# Patient Record
Sex: Male | Born: 1970
Health system: Southern US, Community
[De-identification: ages and names within clinical notes are randomized; demographics above are authoritative.]

## PROBLEM LIST (undated history)

## (undated) DIAGNOSIS — F419 Anxiety disorder, unspecified: Secondary | ICD-10-CM

## (undated) DIAGNOSIS — K219 Gastro-esophageal reflux disease without esophagitis: Secondary | ICD-10-CM

## (undated) HISTORY — PX: HERNIA REPAIR: SHX51

---

## 2007-11-03 ENCOUNTER — Ambulatory Visit: Payer: Self-pay | Admitting: Family Medicine

## 2009-09-04 ENCOUNTER — Ambulatory Visit: Payer: Self-pay | Admitting: Internal Medicine

## 2010-09-18 ENCOUNTER — Ambulatory Visit: Payer: Self-pay | Admitting: Internal Medicine

## 2010-10-12 ENCOUNTER — Ambulatory Visit: Payer: Self-pay | Admitting: Internal Medicine

## 2010-10-31 ENCOUNTER — Ambulatory Visit: Payer: Self-pay | Admitting: Family Medicine

## 2011-01-28 ENCOUNTER — Ambulatory Visit: Payer: Self-pay | Admitting: Internal Medicine

## 2011-12-16 ENCOUNTER — Ambulatory Visit: Payer: Self-pay

## 2012-05-06 ENCOUNTER — Ambulatory Visit: Payer: Self-pay | Admitting: Family Medicine

## 2012-05-06 LAB — URINALYSIS, COMPLETE
Ketone: NEGATIVE
Nitrite: NEGATIVE
Ph: 6.5 (ref 4.5–8.0)
Protein: NEGATIVE

## 2012-05-26 ENCOUNTER — Ambulatory Visit: Payer: Self-pay | Admitting: Internal Medicine

## 2014-02-07 ENCOUNTER — Ambulatory Visit: Payer: Self-pay | Admitting: Physician Assistant

## 2014-02-15 ENCOUNTER — Ambulatory Visit: Payer: Self-pay | Admitting: Family Medicine

## 2014-06-22 IMAGING — CT CT STONE STUDY
1 of 2 series · 11 of 16 positions shown, 14 images · non-contrast
Comparison: none

REASON FOR EXAM: CR [id number]. urine urgency+freq+ hematuria.
COMMENTS:

PROCEDURE:     CT  - CT ABDOMEN /PELVIS WO (STONE)  - May 06, 2012  [DATE]
RESULT:     Comparison: None
TECHNIQUE: Multiple axial images from the lung bases to the symphysis pubis
were obtained without oral and without intravenous contrast.

[Series 2: soft tissue · axial · 0.78mm/px · z∈[-1012,-608]mm · 11 of 163 slices shown, 14 images]
[im 14/163  soft-tissue]
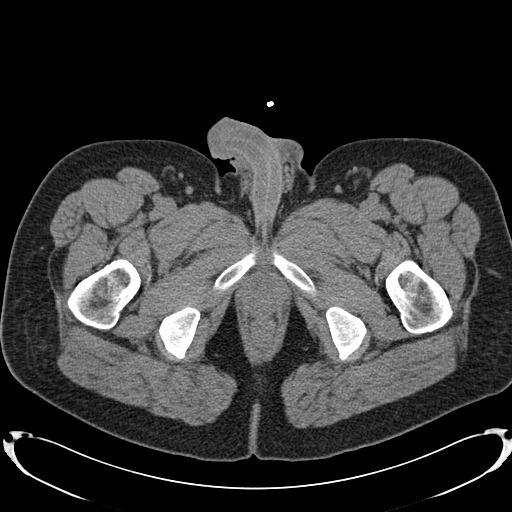
[im 14/163  bone]
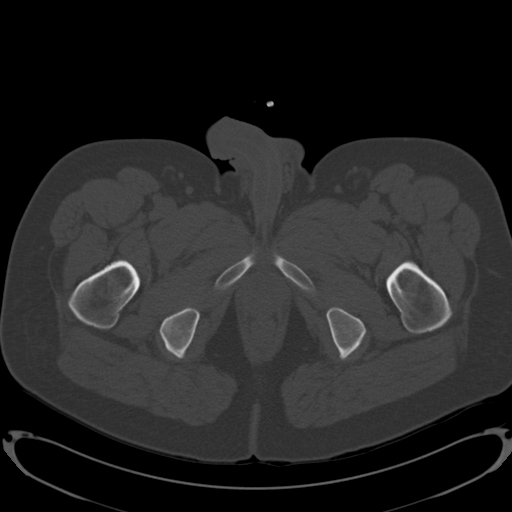
[im 28/163  soft-tissue]
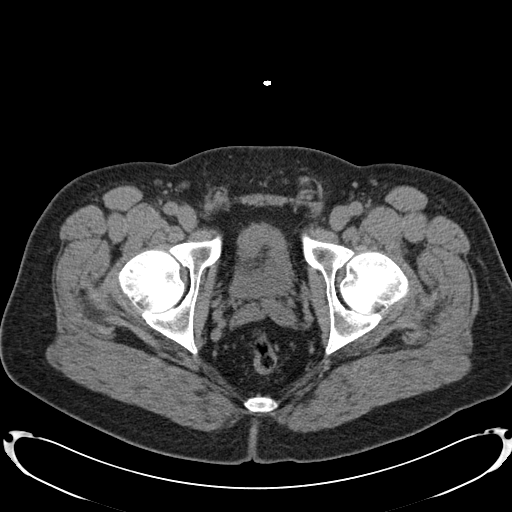
[im 41/163  soft-tissue]
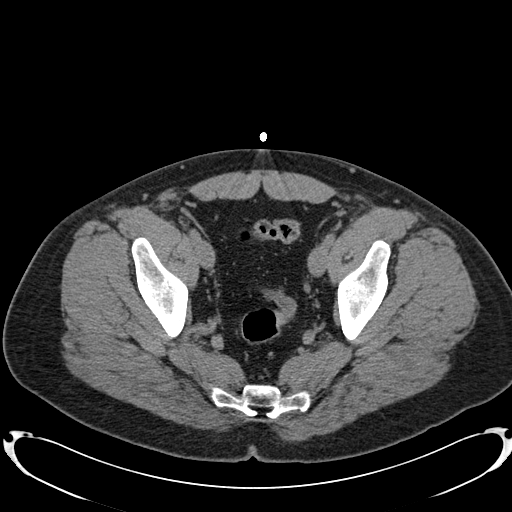
[im 55/163  soft-tissue]
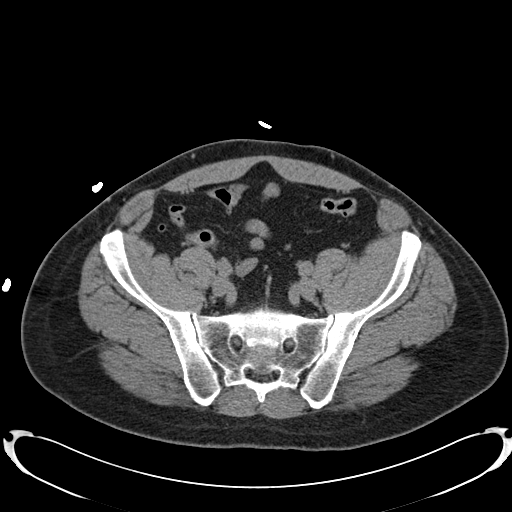
[im 68/163  soft-tissue]
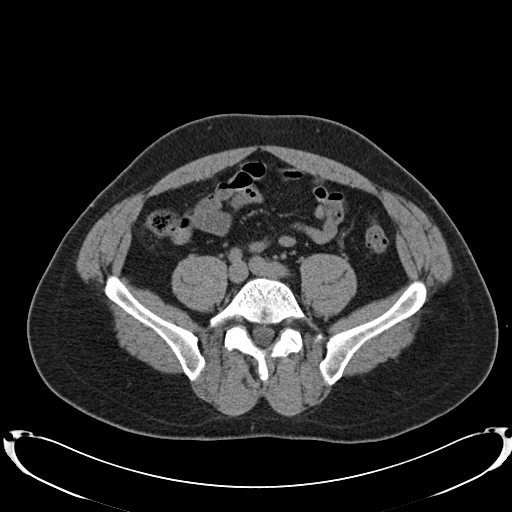
[im 68/163  bone]
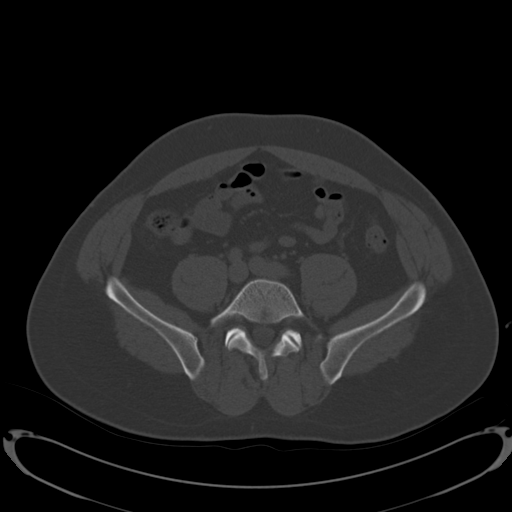
[im 82/163  soft-tissue]
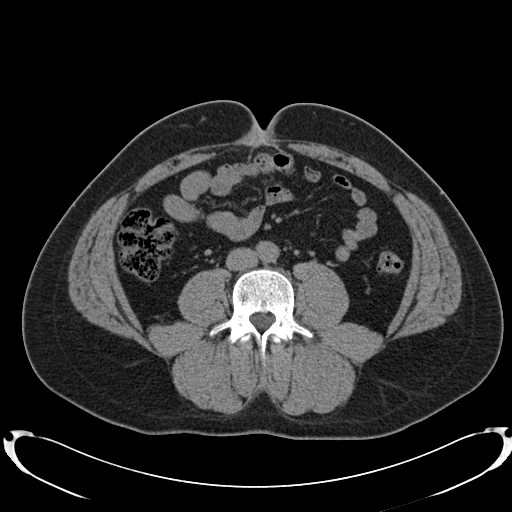
[im 95/163  soft-tissue]
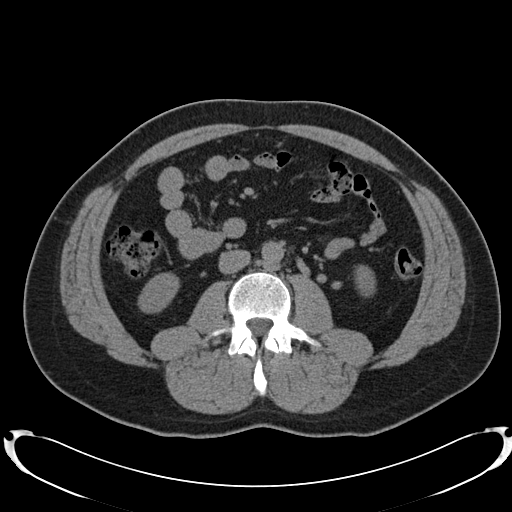
[im 109/163  soft-tissue]
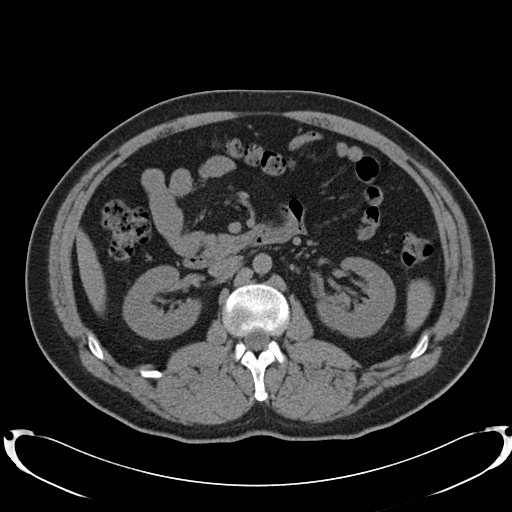
[im 122/163  soft-tissue]
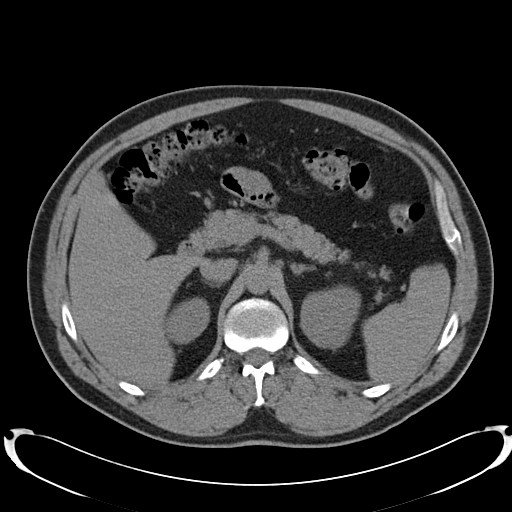
[im 122/163  bone]
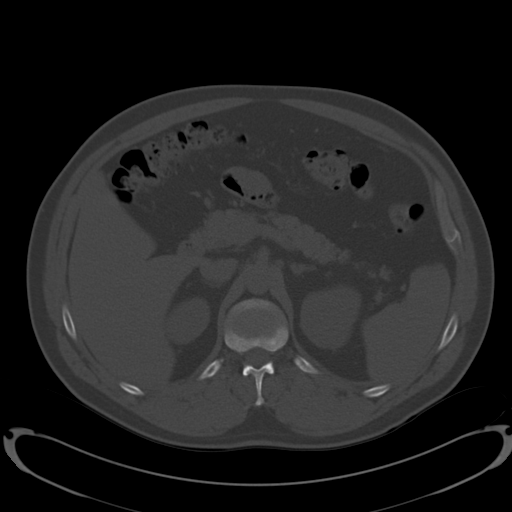
[im 136/163  soft-tissue]
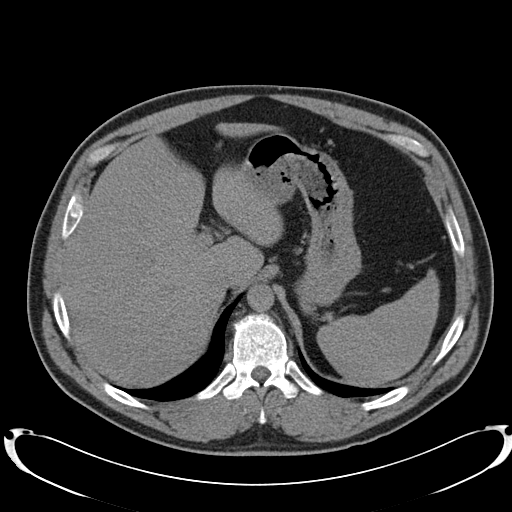
[im 149/163  soft-tissue]
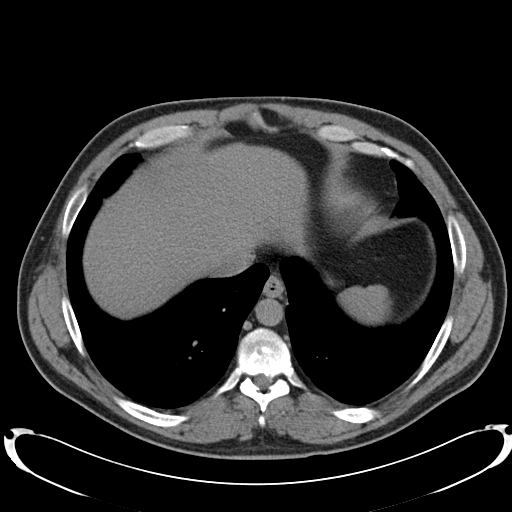

[11 of 16 positions shown; findings below may reference images not displayed]

FINDINGS: Lack of intravenous contrast limits evaluation of the solid abdominal
organs.  Grossly, the liver, gallbladder, spleen, adrenals, and pancreas are
unremarkable. There is a 2 mm calculus in the inferior pole of the left
kidney. No hydronephrosis or ureterectasis. There is a 2 mm calculus at the
left ureterovesical junction.

The small and large bowel are normal in caliber. The appendix is normal.

No aggressive lytic or sclerotic osseous lesions are identified.
IMPRESSION: 2 mm calculus in the left ureterovesical junction, without ureteral
dilatation or hydronephrosis.

[REDACTED]

## 2014-09-22 ENCOUNTER — Ambulatory Visit: Payer: Self-pay | Admitting: Physician Assistant

## 2015-11-15 ENCOUNTER — Encounter: Payer: Self-pay | Admitting: *Deleted

## 2015-11-15 ENCOUNTER — Ambulatory Visit
Admission: EM | Admit: 2015-11-15 | Discharge: 2015-11-15 | Disposition: A | Discharge status: Home / Self Care | Payer: BLUE CROSS/BLUE SHIELD | Attending: Family Medicine | Admitting: Family Medicine

## 2015-11-15 DIAGNOSIS — J111 Influenza due to unidentified influenza virus with other respiratory manifestations: Secondary | ICD-10-CM | POA: Diagnosis not present

## 2015-11-15 LAB — RAPID INFLUENZA A&B ANTIGENS
Influenza A (ARMC): NEGATIVE
Influenza B (ARMC): NEGATIVE

## 2015-11-15 MED ORDER — OSELTAMIVIR PHOSPHATE 75 MG PO CAPS: 75.0000 mg | ORAL_CAPSULE | Freq: Two times a day (BID) | ORAL | Status: DC

## 2015-11-15 MED ORDER — HYDROCOD POLST-CPM POLST ER 10-8 MG/5ML PO SUER: 5.0000 mL | Freq: Every evening | ORAL | Status: DC | PRN

## 2015-11-15 MED ORDER — ACETAMINOPHEN 500 MG PO TABS
1000.0000 mg | ORAL_TABLET | Freq: Once | ORAL | Status: AC
  Administered 2015-11-15: 1000 mg via ORAL

## 2015-11-15 NOTE — ED Provider Notes (Addendum)
CSN: [id number]     Arrival date & time 11/15/15  1153 History   First MD Initiated Contact with Patient 11/15/15 1241     Chief Complaint  Patient presents with  . Cough  . Nasal Congestion  . Generalized Body Aches  . Fever  . Chills   (Consider location/radiation/quality/duration/timing/severity/associated sxs/prior Treatment) HPI: Patient presents today with symptoms of myalgias, fever, cough. Patient states that symptoms started late Saturday and Sunday. Patient denies any chest pain, shortness of breath, nausea, vomiting, diarrhea, abdominal pain. He has been around people with flu. He denies smoking history. He has been taking over-the-counter cough medicine with little relief.  History reviewed. No pertinent past medical history. History reviewed. No pertinent past surgical history. History reviewed. No pertinent family history. Social History  Substance Use Topics  . Smoking status: Never Smoker   . Smokeless tobacco: None  . Alcohol Use: Yes    Review of Systems: Negative except mentioned above.   Allergies  Review of patient's allergies indicates no known allergies.  Home Medications   Prior to Admission medications   Medication Sig Start Date End Date Taking? Authorizing Provider  omeprazole (PRILOSEC) 20 MG capsule Take 20 mg by mouth daily.   Yes Historical Provider, MD  chlorpheniramine-HYDROcodone (TUSSIONEX PENNKINETIC ER) 10-8 MG/5ML SUER Take 5 mLs by mouth at bedtime as needed for cough. 11/15/15   Paulina Fusi, MD  oseltamivir (TAMIFLU) 75 MG capsule Take 1 capsule (75 mg total) by mouth every 12 (twelve) hours. 11/15/15   Paulina Fusi, MD   Meds Ordered and Administered this Visit   Medications  acetaminophen (TYLENOL) tablet 1,000 mg (1,000 mg Oral Given 11/15/15 1220)    BP 133/77 mmHg  Pulse 104  Temp(Src) 101.1 F (38.4 C)  Resp 16  Ht 5\' 6"  (1.676 m)  Wt 210 lb (95.255 kg)  BMI 33.91 kg/m2  SpO2 100% No data found.   Physical Exam    GENERAL: NAD HEENT: mild pharyngeal erythema, no exudate, no erythema of TMs, no cervical LAD RESP: CTA B CARD: tachycardic NEURO: CN II-XII grossly intact   ED Course  Procedures (including critical care time)  Labs Review Labs Reviewed  RAPID INFLUENZA A&B ANTIGENS ([place])    Imaging Review No results found.   MDM   1. Influenza   Will prescribe patient Tamiflu, Tussionex at bedtime, Delsym when necessary during the day, rest, hydration, Tylenol/Motrin when necessary, seek medical attention if symptoms persist or worsen as discussed. Patient addresses understanding of plan.    Paulina Fusi, MD 11/15/15 1301  Paulina Fusi, MD 11/15/15 1302

## 2015-11-15 NOTE — ED Notes (Signed)
Productive cough- green, runny nose, body aches, ? Fever, and chills since Saturday. States seasonal allergies that causes similar symptoms each year about this time.

## 2017-06-19 ENCOUNTER — Encounter: Payer: Self-pay | Admitting: *Deleted

## 2017-06-19 ENCOUNTER — Ambulatory Visit
Admission: EM | Admit: 2017-06-19 | Discharge: 2017-06-19 | Disposition: A | Discharge status: Home / Self Care | Payer: BLUE CROSS/BLUE SHIELD | Attending: Family Medicine | Admitting: Family Medicine

## 2017-06-19 DIAGNOSIS — R05 Cough: Secondary | ICD-10-CM | POA: Diagnosis not present

## 2017-06-19 DIAGNOSIS — J209 Acute bronchitis, unspecified: Secondary | ICD-10-CM | POA: Diagnosis not present

## 2017-06-19 MED ORDER — PREDNISONE 50 MG PO TABS: ORAL_TABLET | ORAL | 0 refills | Status: DC

## 2017-06-19 MED ORDER — HYDROCOD POLST-CPM POLST ER 10-8 MG/5ML PO SUER: 5.0000 mL | Freq: Two times a day (BID) | ORAL | 0 refills | Status: DC | PRN

## 2017-06-19 NOTE — Discharge Instructions (Signed)
Prednisone as prescribed.  Tussionex as needed.  Follow up if you fail to improve or worsen.  Take care  Dr. Lacinda Axon

## 2017-06-19 NOTE — ED Provider Notes (Signed)
[facility]    CSN: [id number] Arrival date & time: 06/19/17  1453     History   Chief Complaint Chief Complaint  Patient presents with  . URI    HPI  46 year old male presents with cough.  Patient reports a productive cough over the past week. States that this is severe and interferes with sleep. Discolored sputum. Worse at night. No associated fever. He does report associated body aches. No fever or chills. No shortness of breath. He's been using Mucinex without resolution. No known exacerbating factors. No other associated symptoms. No other complaints or concerns at this time.   History reviewed. No pertinent past medical history.  History reviewed. No pertinent surgical history.   Home Medications    Prior to Admission medications   Medication Sig Start Date End Date Taking? Authorizing Provider  omeprazole (PRILOSEC) 20 MG capsule Take 20 mg by mouth daily.   Yes [provider]  chlorpheniramine-HYDROcodone (TUSSIONEX PENNKINETIC ER) 10-8 MG/5ML SUER Take 5 mLs by mouth every 12 (twelve) hours as needed. 06/19/17   Coral Spikes, DO  predniSONE (DELTASONE) 50 MG tablet 1 tablet daily x 5 days. 06/19/17   Coral Spikes, DO    Family History History reviewed. No pertinent family history.  Social History Social History  Substance Use Topics  . Smoking status: Never Smoker  . Smokeless tobacco: Never Used  . Alcohol use Yes     Comment: 3-4 a week    Allergies   Patient has no known allergies.   Review of Systems Review of Systems  Constitutional: Negative for chills and fever.  Respiratory: Positive for cough. Negative for shortness of breath.    Physical Exam Triage Vital Signs ED Triage Vitals  Enc Vitals Group     BP 06/19/17 1509 (!) 121/91     Pulse Rate 06/19/17 1509 67     Resp 06/19/17 1509 12     Temp 06/19/17 1509 98.2 F (36.8 C)     Temp Source 06/19/17 1509 Oral     SpO2 --      Weight 06/19/17 1506 210 lb  (95.3 kg)     Height 06/19/17 1506 5\' 6"  (1.676 m)     Head Circumference --      Peak Flow --      Pain Score 06/19/17 1506 3     Pain Loc --      Pain Edu? --      Excl. in [place]? --    No data found.   Updated Vital Signs BP (!) 121/91 (BP Location: Left Arm)   Pulse 67   Temp 98.2 F (36.8 C) (Oral)   Resp 12   Ht 5\' 6"  (1.676 m)   Wt 210 lb (95.3 kg)   BMI 33.89 kg/m   Physical Exam  Constitutional: He is oriented to person, place, and time. He appears well-developed. No distress.  HENT:  Head: Normocephalic and atraumatic.  Mouth/Throat: Oropharynx is clear and moist.  Eyes: Conjunctivae are normal. Right eye exhibits no discharge. Left eye exhibits no discharge.  Neck: Neck supple.  Cardiovascular: Normal rate and regular rhythm.   No murmur heard. Pulmonary/Chest: Effort normal and breath sounds normal. He has no wheezes. He has no rales.  Lymphadenopathy:    He has no cervical adenopathy.  Neurological: He is alert and oriented to person, place, and time.  Psychiatric: He has a normal mood and affect.  Vitals reviewed.  UC Treatments / Results  Labs (all labs ordered are listed, but only abnormal results are displayed) Labs Reviewed - No data to display  EKG  EKG Interpretation None       Radiology No results found.  Procedures Procedures (including critical care time)  Medications Ordered in UC Medications - No data to display   Initial Impression / Assessment and Plan / UC Course  I have reviewed the triage vital signs and the nursing notes.  Pertinent labs & imaging results that were available during my care of the patient were reviewed by me and considered in my medical decision making (see chart for details).     46 year old male presents with acute bronchitis. Treat with prednisone and tussionex.  Final Clinical Impressions(s) / UC Diagnoses   Final diagnoses:  Acute bronchitis, unspecified organism    New  Prescriptions Discharge Medication List as of 06/19/2017  3:18 PM    START taking these medications   Details  chlorpheniramine-HYDROcodone (TUSSIONEX PENNKINETIC ER) 10-8 MG/5ML SUER Take 5 mLs by mouth every 12 (twelve) hours as needed., Starting Thu 06/19/2017, Print    predniSONE (DELTASONE) 50 MG tablet 1 tablet daily x 5 days., Normal       Controlled Substance Prescriptions [place] Controlled Substance Registry consulted? Not Applicable   Coral Spikes, DO 06/19/17 1534

## 2017-06-19 NOTE — ED Triage Notes (Signed)
Started having cold symptoms a week ago. Productive cough, chills and body ache.

## 2017-07-01 ENCOUNTER — Ambulatory Visit
Admission: EM | Admit: 2017-07-01 | Discharge: 2017-07-01 | Disposition: A | Discharge status: Home / Self Care | Payer: BLUE CROSS/BLUE SHIELD | Attending: Family Medicine | Admitting: Family Medicine

## 2017-07-01 ENCOUNTER — Encounter: Payer: Self-pay | Admitting: Emergency Medicine

## 2017-07-01 DIAGNOSIS — R05 Cough: Secondary | ICD-10-CM

## 2017-07-01 DIAGNOSIS — J069 Acute upper respiratory infection, unspecified: Secondary | ICD-10-CM

## 2017-07-01 MED ORDER — PREDNISONE 10 MG (21) PO TBPK: ORAL_TABLET | Freq: Every day | ORAL | 0 refills | Status: DC

## 2017-07-01 MED ORDER — ALBUTEROL SULFATE HFA 108 (90 BASE) MCG/ACT IN AERS: 1.0000 | INHALATION_SPRAY | Freq: Four times a day (QID) | RESPIRATORY_TRACT | 0 refills | Status: DC | PRN

## 2017-07-01 MED ORDER — AZITHROMYCIN 250 MG PO TABS: ORAL_TABLET | ORAL | 0 refills | Status: DC

## 2017-07-01 NOTE — ED Triage Notes (Signed)
Patient was seen here 2 weeks ago for cough and congestion and patient states he is not feeling better.

## 2017-07-01 NOTE — ED Provider Notes (Signed)
[facility]    CSN: [id number] Arrival date & time: 07/01/17  1229     History   Chief Complaint Chief Complaint  Patient presents with  . Cough    HPI William Valenzuela is a 46 y.o. male.   HPI  Is a 46 year old male was evaluated here approximately 2 weeks ago diagnosed with a bronchitis. At that time he was treated with prednisone 5 days and Tussionex. Now  the patient states that he has felt worsening continues to cough. He denies any fever but has had some chills. O2 sats are 100% on room air has had some dizziness with positional change. Cough is productive of yellow-green sputum. He has never smoked. He does have nasal congestion and body aches and fatigue.        History reviewed. No pertinent past medical history.  There are no active problems to display for this patient.   History reviewed. No pertinent surgical history.     Home Medications    Prior to Admission medications   Medication Sig Start Date End Date Taking? Authorizing Provider  albuterol (PROVENTIL HFA;VENTOLIN HFA) 108 (90 Base) MCG/ACT inhaler Inhale 1-2 puffs into the lungs every 6 (six) hours as needed for wheezing or shortness of breath. Use with spacer 07/01/17   Lorin Picket, PA-C  azithromycin (ZITHROMAX Z-PAK) 250 MG tablet Use as per package instructions 07/01/17   Lorin Picket, PA-C  omeprazole (PRILOSEC) 20 MG capsule Take 20 mg by mouth daily.    [provider]  predniSONE (STERAPRED UNI-PAK 21 TAB) 10 MG (21) TBPK tablet Take by mouth daily. Take 6 tabs by mouth daily  for 2 days, then 5 tabs for 2 days, then 4 tabs for 2 days, then 3 tabs for 2 days, 2 tabs for 2 days, then 1 tab by mouth daily for 2 days 07/01/17   Lorin Picket, PA-C    Family History History reviewed. No pertinent family history.  Social History Social History  Substance Use Topics  . Smoking status: Never Smoker  . Smokeless tobacco: Never Used  . Alcohol use Yes    Comment: 3-4 a week     Allergies   Patient has no known allergies.   Review of Systems Review of Systems  Constitutional: Positive for activity change and chills. Negative for appetite change, diaphoresis, fatigue and fever.  HENT: Positive for congestion, postnasal drip, rhinorrhea, sinus pain and sinus pressure.   Respiratory: Positive for cough and shortness of breath. Negative for wheezing and stridor.   All other systems reviewed and are negative.    Physical Exam Triage Vital Signs ED Triage Vitals  Enc Vitals Group     BP 07/01/17 1240 (!) 150/81     Pulse Rate 07/01/17 1240 91     Resp 07/01/17 1240 16     Temp 07/01/17 1240 98.6 F (37 C)     Temp Source 07/01/17 1240 Oral     SpO2 07/01/17 1240 100 %     Weight 07/01/17 1238 222 lb 12.8 oz (101.1 kg)     Height 07/01/17 1238 5\' 6"  (1.676 m)     Head Circumference --      Peak Flow --      Pain Score 07/01/17 1238 6     Pain Loc --      Pain Edu? --      Excl. in [place]? --    No data found.   Updated Vital Signs BP Marland Kitchen)  150/81 (BP Location: Left Arm)   Pulse 91   Temp 98.6 F (37 C) (Oral)   Resp 16   Ht 5\' 6"  (1.676 m)   Wt 222 lb 12.8 oz (101.1 kg)   SpO2 100%   BMI 35.96 kg/m   Visual Acuity Right Eye Distance:   Left Eye Distance:   Bilateral Distance:    Right Eye Near:   Left Eye Near:    Bilateral Near:     Physical Exam  Constitutional: He is oriented to person, place, and time. He appears well-developed and well-nourished. No distress.  HENT:  Head: Normocephalic.  Eyes: Pupils are equal, round, and reactive to light. Right eye exhibits no discharge. Left eye exhibits no discharge.  Neck: Normal range of motion.  Pulmonary/Chest: Effort normal and breath sounds normal.  Musculoskeletal: Normal range of motion.  Lymphadenopathy:    He has no cervical adenopathy.  Neurological: He is alert and oriented to person, place, and time.  Skin: Skin is warm and dry. He is not diaphoretic.    Psychiatric: He has a normal mood and affect. His behavior is normal. Judgment and thought content normal.  Nursing note and vitals reviewed.    UC Treatments / Results  Labs (all labs ordered are listed, but only abnormal results are displayed) Labs Reviewed - No data to display  EKG  EKG Interpretation None       Radiology No results found.  Procedures Procedures (including critical care time)  Medications Ordered in UC Medications - No data to display   Initial Impression / Assessment and Plan / UC Course  I have reviewed the triage vital signs and the nursing notes.  Pertinent labs & imaging results that were available during my care of the patient were reviewed by me and considered in my medical decision making (see chart for details).     Plan: 1. Test/x-ray results and diagnosis reviewed with patient 2. rx as per orders; risks, benefits, potential side effects reviewed with patient 3. Recommend supportive treatment with rest and fluids. We'll start him on another prednisone taper which he states works well with his asthma since portion of the symptoms may be a component of exacerbation with his coughing. His cough has persisted for so long we will also place him on an antibiotic. He requested a refill of his albuterol and he will need follow-up with Dr. Ellison Hughs. 4. F/u prn if symptoms worsen or don't improve   Final Clinical Impressions(s) / UC Diagnoses   Final diagnoses:  Upper respiratory tract infection, unspecified type    New Prescriptions Discharge Medication List as of 07/01/2017  1:29 PM    START taking these medications   Details  albuterol (PROVENTIL HFA;VENTOLIN HFA) 108 (90 Base) MCG/ACT inhaler Inhale 1-2 puffs into the lungs every 6 (six) hours as needed for wheezing or shortness of breath. Use with spacer, Starting Tue 07/01/2017, Normal    azithromycin (ZITHROMAX Z-PAK) 250 MG tablet Use as per package instructions, Normal     predniSONE (STERAPRED UNI-PAK 21 TAB) 10 MG (21) TBPK tablet Take by mouth daily. Take 6 tabs by mouth daily  for 2 days, then 5 tabs for 2 days, then 4 tabs for 2 days, then 3 tabs for 2 days, 2 tabs for 2 days, then 1 tab by mouth daily for 2 days, Starting Tue 07/01/2017, Print         Controlled Substance Prescriptions [place] Controlled Substance Registry consulted? Not Applicable   Crecencio Mc  P, PA-C 07/01/17 1353

## 2018-01-28 ENCOUNTER — Encounter: Payer: Self-pay | Admitting: Emergency Medicine

## 2018-01-28 ENCOUNTER — Other Ambulatory Visit: Payer: Self-pay

## 2018-01-28 ENCOUNTER — Ambulatory Visit
Admission: EM | Admit: 2018-01-28 | Discharge: 2018-01-28 | Disposition: A | Discharge status: Home / Self Care | Payer: BLUE CROSS/BLUE SHIELD

## 2018-01-28 DIAGNOSIS — B9789 Other viral agents as the cause of diseases classified elsewhere: Secondary | ICD-10-CM

## 2018-01-28 DIAGNOSIS — J069 Acute upper respiratory infection, unspecified: Secondary | ICD-10-CM | POA: Diagnosis not present

## 2018-01-28 DIAGNOSIS — R05 Cough: Secondary | ICD-10-CM | POA: Diagnosis not present

## 2018-01-28 HISTORY — DX: Gastro-esophageal reflux disease without esophagitis: K21.9

## 2018-01-28 HISTORY — DX: Anxiety disorder, unspecified: F41.9

## 2018-01-28 MED ORDER — PREDNISONE 10 MG PO TABS: ORAL_TABLET | ORAL | 0 refills | Status: DC

## 2018-01-28 MED ORDER — DOXYCYCLINE HYCLATE 100 MG PO CAPS: 100.0000 mg | ORAL_CAPSULE | Freq: Two times a day (BID) | ORAL | 0 refills | Status: AC

## 2018-01-28 NOTE — Discharge Instructions (Addendum)
Take medication as prescribed. Rest. Drink plenty of fluids. Use home albuterol inhaler as discussed.   Follow up with your primary care physician this week as needed. Return to Urgent care for new or worsening concerns.

## 2018-01-28 NOTE — ED Provider Notes (Signed)
[facility] ____________________________________________  Time seen: Approximately 8:36 AM  I have reviewed the triage vital signs and the nursing notes.   HISTORY  Chief Complaint Cough  HPI William Valenzuela is a 47 y.o. male presenting for evaluation of 5 to 6 days of runny nose, nasal congestion, nasal drainage, cough, chills and body aches.  Reports he had 2 episodes of noticing wheezing when at home.  Did have some leftover Tussionex from previous sickness that he use and did help some with cough.  Reports subjective fevers.  Continues to drink fluids well, slight decrease in appetite.  Possible sick contacts at work, no home sick contacts.  Denies COPD or asthma.  States possible seasonal allergies.  Denies other aggravating or alleviating factors. Denies chest pain, shortness of breath, abdominal pain, dysuria, extremity pain, extremity swelling or rash. Denies recent sickness. Denies recent antibiotic use.    Past Medical History:  Diagnosis Date  . Anxiety   . GERD (gastroesophageal reflux disease)     There are no active problems to display for this patient.   History reviewed. No pertinent surgical history.   No current facility-administered medications for this encounter.   Current Outpatient Medications:  .  albuterol (PROVENTIL HFA;VENTOLIN HFA) 108 (90 Base) MCG/ACT inhaler, Inhale 1-2 puffs into the lungs every 6 (six) hours as needed for wheezing or shortness of breath. Use with spacer, Disp: 1 Inhaler, Rfl: 0 .  citalopram (CELEXA) 20 MG tablet, Take 1 tablet by mouth daily., Disp: , Rfl:  .  omeprazole (PRILOSEC) 20 MG capsule, Take 20 mg by mouth daily., Disp: , Rfl:  .  [START ON 01/30/2018] doxycycline (VIBRAMYCIN) 100 MG capsule, Take 1 capsule (100 mg total) by mouth 2 (two) times daily., Disp: 20 capsule, Rfl: 0 .  predniSONE (DELTASONE) 10 MG tablet, Start 60 mg po day one, then 50 mg po day two, taper by 10 mg daily until complete., Disp: 21  tablet, Rfl: 0  Allergies Patient has no known allergies.  Family History  Problem Relation Age of Onset  . Healthy Mother   . CAD Father   . Hypertension Father   . Hyperlipidemia Father     Social History Social History   Tobacco Use  . Smoking status: Never Smoker  . Smokeless tobacco: Never Used  Substance Use Topics  . Alcohol use: Yes    Alcohol/week: 8.4 oz    Types: 14 Glasses of wine per week  . Drug use: Never    Review of Systems Constitutional: As above.  ENT: No sore throat. Cardiovascular: Denies chest pain. Respiratory: Denies shortness of breath. Gastrointestinal: No abdominal pain.  Musculoskeletal: Negative for back pain. Skin: Negative for rash.   ____________________________________________   PHYSICAL EXAM:  VITAL SIGNS: ED Triage Vitals  Enc Vitals Group     BP 01/28/18 0814 (!) 137/98     Pulse Rate 01/28/18 0814 92     Resp 01/28/18 0814 16     Temp 01/28/18 0814 99.8 F (37.7 C)     Temp Source 01/28/18 0814 Oral     SpO2 01/28/18 0814 99 %     Weight 01/28/18 0813 207 lb (93.9 kg)     Height 01/28/18 0813 5\' 6"  (1.676 m)     Head Circumference --      Peak Flow --      Pain Score 01/28/18 0812 3     Pain Loc --      Pain Edu? --  Excl. in [place]? --    Constitutional: Alert and oriented. Well appearing and in no acute distress. Eyes: Conjunctivae are normal.  Head: Atraumatic. No sinus tenderness to palpation. No swelling. No erythema.  Ears: no erythema, normal TMs bilaterally.   Nose:Nasal congestion with clear rhinorrhea  Mouth/Throat: Mucous membranes are moist. Mild pharyngeal erythema. No tonsillar swelling or exudate.  Neck: No stridor.  No cervical spine tenderness to palpation. Hematological/Lymphatic/Immunilogical: No cervical lymphadenopathy. Cardiovascular: Normal rate, regular rhythm. Grossly normal heart sounds.  Good peripheral circulation. Respiratory: Normal respiratory effort.  No retractions. No wheezes,  rales or rhonchi. Good air movement. Dry intermittent cough with bronchospasm. Musculoskeletal: Ambulatory with steady gait. No cervical, thoracic or lumbar tenderness to palpation. Neurologic:  Normal speech and language. No gait instability. Skin:  Skin appears warm, dry and intact. No rash noted. Psychiatric: Mood and affect are normal. Speech and behavior are normal.    ___________________________________________   LABS (all labs ordered are listed, but only abnormal results are displayed)  Labs Reviewed - No data to display ____________________________________________  PROCEDURES Procedures   INITIAL IMPRESSION / ASSESSMENT AND PLAN / ED COURSE  Pertinent labs & imaging results that were available during my care of the patient were reviewed by me and considered in my medical decision making (see chart for details).  Well-appearing patient.  No acute distress.  Suspect recent viral upper respiratory infection with bronchospasm.  Will treat with prednisone, continue home albuterol inhaler as needed, over-the-counter cough and congestion medication as needed, patient reports he has leftover Tussionex if needed.  Encourage supportive care.  Discussed for no symptom improvement after 3 days will Rx doxycycline.  Discussed strict follow-up and return parameters.Discussed indication, risks and benefits of medications with patient.  Discussed follow up with Primary care physician this week. Discussed follow up and return parameters including no resolution or any worsening concerns. Patient verbalized understanding and agreed to plan.   ____________________________________________   FINAL CLINICAL IMPRESSION(S) / ED DIAGNOSES  Final diagnoses:  Viral URI with cough     ED Discharge Orders        Ordered    doxycycline (VIBRAMYCIN) 100 MG capsule  2 times daily     01/28/18 0831    predniSONE (DELTASONE) 10 MG tablet     01/28/18 0831       Note: This dictation was prepared  with Dragon dictation along with smaller phrase technology. Any transcriptional errors that result from this process are unintentional.         Marylene Land, NP 01/28/18 1121

## 2018-01-28 NOTE — ED Triage Notes (Signed)
Patient in today c/o 5 day history of productive cough, body aches, feverish, chills. Patient has tried OTC Mucinex, Dayquil, Flonase, Claritin.

## 2020-01-24 DIAGNOSIS — Z13220 Encounter for screening for lipoid disorders: Secondary | ICD-10-CM | POA: Diagnosis not present

## 2020-01-24 DIAGNOSIS — F3341 Major depressive disorder, recurrent, in partial remission: Secondary | ICD-10-CM | POA: Diagnosis not present

## 2020-01-24 DIAGNOSIS — E785 Hyperlipidemia, unspecified: Secondary | ICD-10-CM | POA: Diagnosis not present

## 2020-01-24 DIAGNOSIS — Z0000 Encounter for general adult medical examination without abnormal findings: Secondary | ICD-10-CM | POA: Diagnosis not present

## 2020-01-24 DIAGNOSIS — K219 Gastro-esophageal reflux disease without esophagitis: Secondary | ICD-10-CM | POA: Diagnosis not present

## 2020-01-24 DIAGNOSIS — Z125 Encounter for screening for malignant neoplasm of prostate: Secondary | ICD-10-CM | POA: Diagnosis not present

## 2020-07-04 DIAGNOSIS — J4530 Mild persistent asthma, uncomplicated: Secondary | ICD-10-CM | POA: Diagnosis not present

## 2020-07-04 DIAGNOSIS — J301 Allergic rhinitis due to pollen: Secondary | ICD-10-CM | POA: Diagnosis not present

## 2020-08-15 DIAGNOSIS — J4520 Mild intermittent asthma, uncomplicated: Secondary | ICD-10-CM | POA: Diagnosis not present

## 2020-08-15 DIAGNOSIS — E785 Hyperlipidemia, unspecified: Secondary | ICD-10-CM | POA: Diagnosis not present

## 2020-08-15 DIAGNOSIS — F3341 Major depressive disorder, recurrent, in partial remission: Secondary | ICD-10-CM | POA: Diagnosis not present

## 2020-08-15 DIAGNOSIS — K219 Gastro-esophageal reflux disease without esophagitis: Secondary | ICD-10-CM | POA: Diagnosis not present

## 2021-04-10 DIAGNOSIS — M205X1 Other deformities of toe(s) (acquired), right foot: Secondary | ICD-10-CM | POA: Diagnosis not present

## 2021-04-10 DIAGNOSIS — M79671 Pain in right foot: Secondary | ICD-10-CM | POA: Diagnosis not present

## 2021-04-10 DIAGNOSIS — M19071 Primary osteoarthritis, right ankle and foot: Secondary | ICD-10-CM | POA: Diagnosis not present

## 2021-05-10 DIAGNOSIS — D225 Melanocytic nevi of trunk: Secondary | ICD-10-CM | POA: Diagnosis not present

## 2021-05-10 DIAGNOSIS — L821 Other seborrheic keratosis: Secondary | ICD-10-CM | POA: Diagnosis not present

## 2021-06-13 DIAGNOSIS — E785 Hyperlipidemia, unspecified: Secondary | ICD-10-CM | POA: Diagnosis not present

## 2021-06-13 DIAGNOSIS — F3341 Major depressive disorder, recurrent, in partial remission: Secondary | ICD-10-CM | POA: Diagnosis not present

## 2021-06-13 DIAGNOSIS — K219 Gastro-esophageal reflux disease without esophagitis: Secondary | ICD-10-CM | POA: Diagnosis not present

## 2021-06-13 DIAGNOSIS — Z0000 Encounter for general adult medical examination without abnormal findings: Secondary | ICD-10-CM | POA: Diagnosis not present

## 2021-06-13 DIAGNOSIS — Z125 Encounter for screening for malignant neoplasm of prostate: Secondary | ICD-10-CM | POA: Diagnosis not present

## 2021-07-03 DIAGNOSIS — J301 Allergic rhinitis due to pollen: Secondary | ICD-10-CM | POA: Diagnosis not present

## 2021-07-03 DIAGNOSIS — J4530 Mild persistent asthma, uncomplicated: Secondary | ICD-10-CM | POA: Diagnosis not present

## 2021-07-09 ENCOUNTER — Other Ambulatory Visit: Payer: Self-pay

## 2021-07-09 ENCOUNTER — Ambulatory Visit
Admission: RE | Admit: 2021-07-09 | Discharge: 2021-07-09 | Disposition: A | Discharge status: Home / Self Care | Payer: BC Managed Care – PPO | Source: Ambulatory Visit | Attending: Emergency Medicine | Admitting: Emergency Medicine

## 2021-07-09 VITALS — BP 147/101 | HR 76 | Temp 99.1°F | Resp 18

## 2021-07-09 DIAGNOSIS — R030 Elevated blood-pressure reading, without diagnosis of hypertension: Secondary | ICD-10-CM

## 2021-07-09 DIAGNOSIS — J4521 Mild intermittent asthma with (acute) exacerbation: Secondary | ICD-10-CM | POA: Diagnosis not present

## 2021-07-09 DIAGNOSIS — B349 Viral infection, unspecified: Secondary | ICD-10-CM | POA: Diagnosis not present

## 2021-07-09 MED ORDER — PREDNISONE 10 MG PO TABS: 40.0000 mg | ORAL_TABLET | Freq: Every day | ORAL | 0 refills | Status: AC

## 2021-07-09 MED ORDER — AZITHROMYCIN 250 MG PO TABS: 250.0000 mg | ORAL_TABLET | Freq: Every day | ORAL | 0 refills | Status: AC

## 2021-07-09 MED ORDER — ALBUTEROL SULFATE HFA 108 (90 BASE) MCG/ACT IN AERS: 1.0000 | INHALATION_SPRAY | Freq: Four times a day (QID) | RESPIRATORY_TRACT | 0 refills | Status: AC | PRN

## 2021-07-09 NOTE — Discharge Instructions (Addendum)
Use the albuterol inhaler as directed.  Take the Zithromax and prednisone as directed.    Your COVID and flu tests are pending.  Your blood pressure is elevated today at 160/115; repeat 147/101.  Please have this rechecked by your primary care provider in 1-2 weeks.

## 2021-07-09 NOTE — ED Provider Notes (Signed)
William Valenzuela    CSN: [id number] Arrival date & time: 07/09/21  1804      History   Chief Complaint Chief Complaint  Patient presents with   Cough   Chills   Nausea   Appointment    1800    HPI William Valenzuela is a 50 y.o. male.  Patient presents with 6-day history of fever, body aches, cough, wheezing, nausea.  He denies shortness of breath, vomiting, diarrhea, or other symptoms.  Several OTC treatments attempted.  He has also been using his albuterol inhaler but thinks it may be out of date.  His medical history includes asthma, GERD, hyperlipidemia, depression.  The history is provided by the patient and medical records.   Past Medical History:  Diagnosis Date   Anxiety    GERD (gastroesophageal reflux disease)     There are no problems to display for this patient.   History reviewed. No pertinent surgical history.     Home Medications    Prior to Admission medications   Medication Sig Start Date End Date Taking? Authorizing Provider  albuterol (VENTOLIN HFA) 108 (90 Base) MCG/ACT inhaler Inhale 1-2 puffs into the lungs every 6 (six) hours as needed for wheezing or shortness of breath. 07/09/21  Yes Sharion Balloon, NP  azithromycin (ZITHROMAX) 250 MG tablet Take 1 tablet (250 mg total) by mouth daily. Take first 2 tablets together, then 1 every day until finished. 07/09/21  Yes Sharion Balloon, NP  predniSONE (DELTASONE) 10 MG tablet Take 4 tablets (40 mg total) by mouth daily for 5 days. 07/09/21 07/14/21 Yes Sharion Balloon, NP  citalopram (CELEXA) 20 MG tablet Take 1 tablet by mouth daily. 01/02/18   [provider]  doxycycline (VIBRAMYCIN) 100 MG capsule Take 1 capsule (100 mg total) by mouth 2 (two) times daily. 01/30/18   Marylene Land, NP  omeprazole (PRILOSEC) 20 MG capsule Take 20 mg by mouth daily.    [provider]    Family History Family History  Problem Relation Age of Onset   Healthy Mother    CAD Father     Hypertension Father    Hyperlipidemia Father     Social History Social History   Tobacco Use   Smoking status: Never   Smokeless tobacco: Never  Vaping Use   Vaping Use: Never used  Substance Use Topics   Alcohol use: Yes    Alcohol/week: 14.0 standard drinks    Types: 14 Glasses of wine per week   Drug use: Never     Allergies   Other   Review of Systems Review of Systems  Constitutional:  Positive for chills and fever.  HENT:  Negative for ear pain and sore throat.   Respiratory:  Positive for cough and wheezing. Negative for shortness of breath.   Cardiovascular:  Negative for chest pain and palpitations.  Gastrointestinal:  Positive for nausea. Negative for abdominal pain, diarrhea and vomiting.  Skin:  Negative for color change and rash.  All other systems reviewed and are negative.   Physical Exam Triage Vital Signs ED Triage Vitals  Enc Vitals Group     BP      Pulse      Resp      Temp      Temp src      SpO2      Weight      Height      Head Circumference      Peak Flow  Pain Score      Pain Loc      Pain Edu?      Excl. in [place]?    No data found.  Updated Vital Signs BP (!) 147/101 (BP Location: Left Arm)   Pulse 76   Temp 99.1 F (37.3 C) (Oral)   Resp 18   SpO2 96%   Visual Acuity Right Eye Distance:   Left Eye Distance:   Bilateral Distance:    Right Eye Near:   Left Eye Near:    Bilateral Near:     Physical Exam Vitals and nursing note reviewed.  Constitutional:      General: He is not in acute distress.    Appearance: He is well-developed.  HENT:     Head: Normocephalic and atraumatic.     Right Ear: Tympanic membrane normal.     Left Ear: Tympanic membrane normal.     Nose: Nose normal.     Mouth/Throat:     Mouth: Mucous membranes are moist.     Pharynx: Oropharynx is clear.  Eyes:     Conjunctiva/sclera: Conjunctivae normal.  Cardiovascular:     Rate and Rhythm: Normal rate and regular rhythm.     Heart  sounds: Normal heart sounds.  Pulmonary:     Effort: Pulmonary effort is normal. No respiratory distress.     Breath sounds: Normal breath sounds. No wheezing.  Abdominal:     Palpations: Abdomen is soft.     Tenderness: There is no abdominal tenderness.  Musculoskeletal:     Cervical back: Neck supple.  Skin:    General: Skin is warm and dry.  Neurological:     General: No focal deficit present.     Mental Status: He is alert and oriented to person, place, and time.     Gait: Gait normal.  Psychiatric:        Mood and Affect: Mood normal.        Behavior: Behavior normal.     UC Treatments / Results  Labs (all labs ordered are listed, but only abnormal results are displayed) Labs Reviewed  COVID-19, FLU A+B NAA    EKG   Radiology No results found.  Procedures Procedures (including critical care time)  Medications Ordered in UC Medications - No data to display  Initial Impression / Assessment and Plan / UC Course  I have reviewed the triage vital signs and the nursing notes.  Pertinent labs & imaging results that were available during my care of the patient were reviewed by me and considered in my medical decision making (see chart for details).  Asthma exacerbation.  Elevated blood pressure reading.  Treating with albuterol inhaler, prednisone, Zithromax.  COVID and flu pending.  Instructed patient to follow-up with his PCP if his symptoms are not improving.  Also discussed that his blood pressure is elevated today and needs to be rechecked by his PCP in 1 to 2 weeks.  Education provided on preventing hypertension.  Patient agrees to plan of care.   Final Clinical Impressions(s) / UC Diagnoses   Final diagnoses:  Mild intermittent asthma with acute exacerbation  Elevated blood pressure reading     Discharge Instructions      Use the albuterol inhaler as directed.  Take the Zithromax and prednisone as directed.    Your COVID and flu tests are  pending.  Your blood pressure is elevated today at 160/115; repeat 147/101.  Please have this rechecked by your primary care provider in 1-2  weeks.          ED Prescriptions     Medication Sig Dispense Auth. Provider   albuterol (VENTOLIN HFA) 108 (90 Base) MCG/ACT inhaler Inhale 1-2 puffs into the lungs every 6 (six) hours as needed for wheezing or shortness of breath. 18 g Sharion Balloon, NP   predniSONE (DELTASONE) 10 MG tablet Take 4 tablets (40 mg total) by mouth daily for 5 days. 20 tablet Sharion Balloon, NP   azithromycin (ZITHROMAX) 250 MG tablet Take 1 tablet (250 mg total) by mouth daily. Take first 2 tablets together, then 1 every day until finished. 6 tablet Sharion Balloon, NP      PDMP not reviewed this encounter.   Sharion Balloon, NP 07/09/21 [phone]

## 2021-07-09 NOTE — ED Triage Notes (Signed)
Patient presents to Urgent Care with complaints of cough, fever, body aches, diarrhea, chills, and nausea x 6 days. Treating symptoms with nyquil, dayquil, and tylenol. Two negative covid tests weds/thurs.

## 2021-07-10 LAB — COVID-19, FLU A+B NAA
Influenza A, NAA: NOT DETECTED
Influenza B, NAA: NOT DETECTED
SARS-CoV-2, NAA: NOT DETECTED

## 2021-07-27 DIAGNOSIS — E6609 Other obesity due to excess calories: Secondary | ICD-10-CM | POA: Diagnosis not present

## 2021-07-27 DIAGNOSIS — R059 Cough, unspecified: Secondary | ICD-10-CM | POA: Diagnosis not present

## 2021-07-27 DIAGNOSIS — J45909 Unspecified asthma, uncomplicated: Secondary | ICD-10-CM | POA: Diagnosis not present

## 2021-07-27 DIAGNOSIS — Z1331 Encounter for screening for depression: Secondary | ICD-10-CM | POA: Diagnosis not present

## 2021-07-27 DIAGNOSIS — Z6833 Body mass index (BMI) 33.0-33.9, adult: Secondary | ICD-10-CM | POA: Diagnosis not present

## 2021-09-26 ENCOUNTER — Other Ambulatory Visit: Payer: Self-pay

## 2021-09-26 DIAGNOSIS — Z1211 Encounter for screening for malignant neoplasm of colon: Secondary | ICD-10-CM

## 2021-09-26 MED ORDER — NA SULFATE-K SULFATE-MG SULF 17.5-3.13-1.6 GM/177ML PO SOLN: 1.0000 | Freq: Once | ORAL | 0 refills | Status: AC

## 2021-09-26 NOTE — Progress Notes (Signed)
Gastroenterology Pre-Procedure Review  Request Date: 10/16/2021 Requesting Physician: Dr. Marius Ditch  PATIENT REVIEW QUESTIONS: The patient responded to the following health history questions as indicated:    1. Are you having any GI issues? no 2. Do you have a personal history of Polyps? no 3. Do you have a family history of Colon Cancer or Polyps? no 4. Diabetes Mellitus? no 5. Joint replacements in the past 12 months?no 6. Major health problems in the past 3 months?no 7. Any artificial heart valves, MVP, or defibrillator?no    MEDICATIONS & ALLERGIES:    Patient reports the following regarding taking any anticoagulation/antiplatelet therapy:   Plavix, Coumadin, Eliquis, Xarelto, Lovenox, Pradaxa, Brilinta, or Effient? no Aspirin? no  Patient confirms/reports the following medications:  Current Outpatient Medications  Medication Sig Dispense Refill   albuterol (VENTOLIN HFA) 108 (90 Base) MCG/ACT inhaler Inhale 1-2 puffs into the lungs every 6 (six) hours as needed for wheezing or shortness of breath. 18 g 0   azithromycin (ZITHROMAX) 250 MG tablet Take 1 tablet (250 mg total) by mouth daily. Take first 2 tablets together, then 1 every day until finished. 6 tablet 0   citalopram (CELEXA) 20 MG tablet Take 1 tablet by mouth daily.     doxycycline (VIBRAMYCIN) 100 MG capsule Take 1 capsule (100 mg total) by mouth 2 (two) times daily. 20 capsule 0   omeprazole (PRILOSEC) 20 MG capsule Take 20 mg by mouth daily.     No current facility-administered medications for this visit.    Patient confirms/reports the following allergies:  Allergies  Allergen Reactions   Other Other (See Comments)    Sneezing     No orders of the defined types were placed in this encounter.   AUTHORIZATION INFORMATION Primary Insurance: 1D#: Group #:  Secondary Insurance: 1D#: Group #:  SCHEDULE INFORMATION: Date: 10/16/2021 Time: Location:[facility]

## 2021-10-15 ENCOUNTER — Encounter: Payer: Self-pay | Admitting: Gastroenterology

## 2021-10-16 ENCOUNTER — Encounter
Admission: RE | Disposition: A | Discharge status: Home / Self Care | Payer: Self-pay | Source: Home / Self Care | Attending: Gastroenterology

## 2021-10-16 ENCOUNTER — Ambulatory Visit
Admission: RE | Admit: 2021-10-16 | Discharge: 2021-10-16 | Disposition: A | Discharge status: Home / Self Care | Payer: BC Managed Care – PPO | Attending: Gastroenterology | Admitting: Gastroenterology

## 2021-10-16 ENCOUNTER — Encounter: Payer: Self-pay | Admitting: Gastroenterology

## 2021-10-16 ENCOUNTER — Ambulatory Visit: Payer: BC Managed Care – PPO | Admitting: Certified Registered Nurse Anesthetist

## 2021-10-16 DIAGNOSIS — D125 Benign neoplasm of sigmoid colon: Secondary | ICD-10-CM | POA: Diagnosis not present

## 2021-10-16 DIAGNOSIS — D122 Benign neoplasm of ascending colon: Secondary | ICD-10-CM | POA: Insufficient documentation

## 2021-10-16 DIAGNOSIS — D126 Benign neoplasm of colon, unspecified: Secondary | ICD-10-CM

## 2021-10-16 DIAGNOSIS — Z1211 Encounter for screening for malignant neoplasm of colon: Secondary | ICD-10-CM | POA: Diagnosis not present

## 2021-10-16 DIAGNOSIS — K5730 Diverticulosis of large intestine without perforation or abscess without bleeding: Secondary | ICD-10-CM | POA: Insufficient documentation

## 2021-10-16 DIAGNOSIS — K635 Polyp of colon: Secondary | ICD-10-CM | POA: Diagnosis not present

## 2021-10-16 DIAGNOSIS — D120 Benign neoplasm of cecum: Secondary | ICD-10-CM | POA: Diagnosis not present

## 2021-10-16 HISTORY — PX: COLONOSCOPY WITH PROPOFOL: SHX5780

## 2021-10-16 SURGERY — COLONOSCOPY WITH PROPOFOL
Anesthesia: General

## 2021-10-16 MED ORDER — PROPOFOL 500 MG/50ML IV EMUL
INTRAVENOUS | Status: AC
  Filled 2021-10-16: qty 50

## 2021-10-16 MED ORDER — LIDOCAINE HCL (PF) 2 % IJ SOLN
INTRAMUSCULAR | Status: AC
  Filled 2021-10-16: qty 5

## 2021-10-16 MED ORDER — PROPOFOL 500 MG/50ML IV EMUL
INTRAVENOUS | Status: DC | PRN
  Administered 2021-10-16: 200 ug/kg/min via INTRAVENOUS

## 2021-10-16 MED ORDER — SODIUM CHLORIDE 0.9 % IV SOLN: INTRAVENOUS | Status: DC

## 2021-10-16 MED ORDER — LIDOCAINE HCL (CARDIAC) PF 100 MG/5ML IV SOSY
PREFILLED_SYRINGE | INTRAVENOUS | Status: DC | PRN
  Administered 2021-10-16: 50 mg via INTRAVENOUS

## 2021-10-16 MED ORDER — PROPOFOL 10 MG/ML IV BOLUS
INTRAVENOUS | Status: DC | PRN
  Administered 2021-10-16: 70 mg via INTRAVENOUS

## 2021-10-16 MED ORDER — STERILE WATER FOR IRRIGATION IR SOLN
Status: DC | PRN
  Administered 2021-10-16 (×2): 60 mL

## 2021-10-16 NOTE — H&P (Signed)
Cephas Darby, MD [address]  [place]  [place], [place] [address]  Main: [phone]  Fax: [phone] Pager: [phone]  Primary Care Physician:  Sofie Hartigan, MD Primary Gastroenterologist:  Dr. Cephas Darby  Pre-Procedure History & Physical: HPI:  William Valenzuela is a 51 y.o. male is here for an colonoscopy.   Past Medical History:  Diagnosis Date   Anxiety    GERD (gastroesophageal reflux disease)     Past Surgical History:  Procedure Laterality Date   HERNIA REPAIR      Prior to Admission medications   Medication Sig Start Date End Date Taking? Authorizing Provider  albuterol (VENTOLIN HFA) 108 (90 Base) MCG/ACT inhaler Inhale 1-2 puffs into the lungs every 6 (six) hours as needed for wheezing or shortness of breath. 07/09/21  Yes Sharion Balloon, NP  omeprazole (PRILOSEC) 20 MG capsule Take 20 mg by mouth daily.   Yes [provider]  azithromycin (ZITHROMAX) 250 MG tablet Take 1 tablet (250 mg total) by mouth daily. Take first 2 tablets together, then 1 every day until finished. Patient not taking: Reported on 10/16/2021 07/09/21   Sharion Balloon, NP  citalopram (CELEXA) 20 MG tablet Take 1 tablet by mouth daily. 01/02/18   [provider]  doxycycline (VIBRAMYCIN) 100 MG capsule Take 1 capsule (100 mg total) by mouth 2 (two) times daily. Patient not taking: Reported on 10/16/2021 01/30/18   Marylene Land, NP    Allergies as of 09/26/2021 - Review Complete 09/26/2021  Allergen Reaction Noted   Other Other (See Comments) 01/24/2020    Family History  Problem Relation Age of Onset   Healthy Mother    CAD Father    Hypertension Father    Hyperlipidemia Father     Social History   Socioeconomic History   Marital status: Single    Spouse name: Not on file   Number of children: Not on file   Years of education: Not on file   Highest education level: Not on file  Occupational History   Not on file  Tobacco Use   Smoking  status: Never   Smokeless tobacco: Never  Vaping Use   Vaping Use: Never used  Substance and Sexual Activity   Alcohol use: Yes    Alcohol/week: 14.0 standard drinks    Types: 14 Glasses of wine per week   Drug use: Never   Sexual activity: Not on file  Other Topics Concern   Not on file  Social History Narrative   Not on file   Social Determinants of Health   Financial Resource Strain: Not on file  Food Insecurity: Not on file  Transportation Needs: Not on file  Physical Activity: Not on file  Stress: Not on file  Social Connections: Not on file  Intimate Partner Violence: Not on file    Review of Systems: See HPI, otherwise negative ROS  Physical Exam: BP (!) 140/92    Pulse 75    Temp (!) 96.3 F (35.7 C)    Resp 18    Ht 5\' 6"  (1.676 m)    Wt 91.2 kg    SpO2 100%    BMI 32.44 kg/m  General:   Alert,  pleasant and cooperative in NAD Head:  Normocephalic and atraumatic. Neck:  Supple; no masses or thyromegaly. Lungs:  Clear throughout to auscultation.    Heart:  Regular rate and rhythm. Abdomen:  Soft, nontender and nondistended. Normal bowel sounds, without guarding, and without  rebound.   Neurologic:  Alert and  oriented x4;  grossly normal neurologically.  Impression/Plan: William Valenzuela is here for an colonoscopy to be performed for colon cancer screening  Risks, benefits, limitations, and alternatives regarding  colonoscopy have been reviewed with the patient.  Questions have been answered.  All parties agreeable.   Sherri Sear, MD  10/16/2021, 10:31 AM

## 2021-10-16 NOTE — Anesthesia Preprocedure Evaluation (Signed)
Anesthesia Evaluation  Patient identified by MRN, date of birth, ID band Patient awake    Reviewed: Allergy & Precautions, NPO status , Patient's Chart, lab work & pertinent test results  History of Anesthesia Complications Negative for: history of anesthetic complications  Airway Mallampati: III  TM Distance: >3 FB Neck ROM: full    Dental  (+) Chipped   Pulmonary neg pulmonary ROS, neg shortness of breath,    Pulmonary exam normal        Cardiovascular Exercise Tolerance: Good (-) Past MI negative cardio ROS Normal cardiovascular exam     Neuro/Psych PSYCHIATRIC DISORDERS negative neurological ROS     GI/Hepatic Neg liver ROS, GERD  Controlled,  Endo/Other  negative endocrine ROS  Renal/GU negative Renal ROS  negative genitourinary   Musculoskeletal   Abdominal   Peds  Hematology negative hematology ROS (+)   Anesthesia Other Findings Past Medical History: No date: Anxiety No date: GERD (gastroesophageal reflux disease)  Past Surgical History: No date: HERNIA REPAIR     Reproductive/Obstetrics negative OB ROS                             Anesthesia Physical Anesthesia Plan  ASA: 2  Anesthesia Plan: General   Post-op Pain Management:    Induction: Intravenous  PONV Risk Score and Plan: Propofol infusion and TIVA  Airway Management Planned: Natural Airway and Nasal Cannula  Additional Equipment:   Intra-op Plan:   Post-operative Plan:   Informed Consent: I have reviewed the patients History and Physical, chart, labs and discussed the procedure including the risks, benefits and alternatives for the proposed anesthesia with the patient or authorized representative who has indicated his/her understanding and acceptance.     Dental Advisory Given  Plan Discussed with: Anesthesiologist, CRNA and Surgeon  Anesthesia Plan Comments: (Patient consented for risks of  anesthesia including but not limited to:  - adverse reactions to medications - risk of airway placement if required - damage to eyes, teeth, lips or other oral mucosa - nerve damage due to positioning  - sore throat or hoarseness - Damage to heart, brain, nerves, lungs, other parts of body or loss of life  Patient voiced understanding.)        Anesthesia Quick Evaluation

## 2021-10-16 NOTE — Anesthesia Postprocedure Evaluation (Signed)
Anesthesia Post Note  Patient: William Valenzuela  Procedure(s) Performed: COLONOSCOPY WITH PROPOFOL  Patient location during evaluation: Endoscopy Anesthesia Type: General Level of consciousness: awake and alert Pain management: pain level controlled Vital Signs Assessment: post-procedure vital signs reviewed and stable Respiratory status: spontaneous breathing, nonlabored ventilation, respiratory function stable and patient connected to nasal cannula oxygen Cardiovascular status: blood pressure returned to baseline and stable Postop Assessment: no apparent nausea or vomiting Anesthetic complications: no   No notable events documented.   Last Vitals:  Vitals:   10/16/21 1120 10/16/21 1122  BP:  120/66  Pulse: 68 66  Resp: 16 14  Temp:    SpO2: 99% 100%    Last Pain:  Vitals:   10/16/21 1122  TempSrc:   PainSc: 0-No pain                 Precious Haws Mada Sadik

## 2021-10-16 NOTE — Op Note (Signed)
[facility] Gastroenterology Patient Name: William Valenzuela Procedure Date: 10/16/2021 10:37 AM MRN: [id number] Account #: [id number] Date of Birth: 08/27/1971 Admit Type: Outpatient Age: 51 Room: [facility] ENDO ROOM 3 Gender: Male Note Status: Finalized Instrument Name: Park Meo [id number] Procedure:             Colonoscopy Indications:           Screening for colorectal malignant neoplasm, This is                         the patient's first colonoscopy Providers:             Lin Landsman MD, MD Referring MD:          Sofie Hartigan (Referring MD) Medicines:             General Anesthesia Complications:         No immediate complications. Estimated blood loss: None. Procedure:             Pre-Anesthesia Assessment:                        - Prior to the procedure, a History and Physical was                         performed, and patient medications and allergies were                         reviewed. The patient is competent. The risks and                         benefits of the procedure and the sedation options and                         risks were discussed with the patient. All questions                         were answered and informed consent was obtained.                         Patient identification and proposed procedure were                         verified by the physician, the nurse, the                         anesthesiologist, the anesthetist and the technician                         in the pre-procedure area in the procedure room in the                         endoscopy suite. Mental Status Examination: alert and                         oriented. Airway Examination: normal oropharyngeal                         airway and neck mobility. Respiratory Examination:  clear to auscultation. CV Examination: normal.                         Prophylactic Antibiotics: The patient does not require                         prophylactic  antibiotics. Prior Anticoagulants: The                         patient has taken no previous anticoagulant or                         antiplatelet agents. ASA Grade Assessment: II - A                         patient with mild systemic disease. After reviewing                         the risks and benefits, the patient was deemed in                         satisfactory condition to undergo the procedure. The                         anesthesia plan was to use general anesthesia.                         Immediately prior to administration of medications,                         the patient was re-assessed for adequacy to receive                         sedatives. The heart rate, respiratory rate, oxygen                         saturations, blood pressure, adequacy of pulmonary                         ventilation, and response to care were monitored                         throughout the procedure. The physical status of the                         patient was re-assessed after the procedure.                        After obtaining informed consent, the colonoscope was                         passed under direct vision. Throughout the procedure,                         the patient's blood pressure, pulse, and oxygen                         saturations were monitored continuously. The  Colonoscope was introduced through the anus and                         advanced to the the cecum, identified by appendiceal                         orifice and ileocecal valve. The colonoscopy was                         performed without difficulty. The patient tolerated                         the procedure well. The quality of the bowel                         preparation was evaluated using the BBPS [facility] Bowel                         Preparation Scale) with scores of: Right Colon = 3,                         Transverse Colon = 3 and Left Colon = 3 (entire mucosa                          seen well with no residual staining, small fragments                         of stool or opaque liquid). The total BBPS score                         equals 9. Findings:      The perianal and digital rectal examinations were normal. Pertinent       negatives include normal sphincter tone and no palpable rectal lesions.      A diminutive polyp was found in the cecum. The polyp was sessile. The       polyp was removed with a jumbo cold forceps. Resection and retrieval       were complete. Estimated blood loss: none.      Two sessile polyps were found in the sigmoid colon and ascending colon.       The polyps were 5 to 6 mm in size. These polyps were removed with a cold       snare. Resection and retrieval were complete. Estimated blood loss: none.      Multiple small-mouthed diverticula were found in the recto-sigmoid colon       and sigmoid colon.      The retroflexed view of the distal rectum and anal verge was normal and       showed no anal or rectal abnormalities. Impression:            - One diminutive polyp in the cecum, removed with a                         jumbo cold forceps. Resected and retrieved.                        - Two 5 to 6 mm polyps in the sigmoid colon and in the  ascending colon, removed with a cold snare. Resected                         and retrieved.                        - Diverticulosis in the recto-sigmoid colon and in the                         sigmoid colon.                        - The distal rectum and anal verge are normal on                         retroflexion view. Recommendation:        - Discharge patient to home (with escort).                        - Resume previous diet today.                        - Continue present medications.                        - Await pathology results.                        - Repeat colonoscopy in 3 - 5 years for surveillance                         based on pathology results. Procedure  Code(s):     --- Professional ---                        [phone], Colonoscopy, flexible; with removal of                         tumor(s), polyp(s), or other lesion(s) by snare                         technique                        45380, 15, Colonoscopy, flexible; with biopsy, single                         or multiple Diagnosis Code(s):     --- Professional ---                        K63.5, Polyp of colon                        Z12.11, Encounter for screening for malignant neoplasm                         of colon                        K57.30, Diverticulosis of large intestine without  perforation or abscess without bleeding CPT copyright 2019 American Medical Association. All rights reserved. The codes documented in this report are preliminary and upon coder review may  be revised to meet current compliance requirements. Dr. Ulyess Mort Lin Landsman MD, MD 10/16/2021 10:59:11 AM This report has been signed electronically. Number of Addenda: 0 Note Initiated On: 10/16/2021 10:37 AM Scope Withdrawal Time: 0 hours 11 minutes 28 seconds  Total Procedure Duration: 0 hours 12 minutes 48 seconds  Estimated Blood Loss:  Estimated blood loss: none.      [facility]

## 2021-10-16 NOTE — Transfer of Care (Signed)
Immediate Anesthesia Transfer of Care Note  Patient: William Valenzuela  Procedure(s) Performed: COLONOSCOPY WITH PROPOFOL  Patient Location: Endoscopy Unit  Anesthesia Type:General  Level of Consciousness: drowsy  Airway & Oxygen Therapy: Patient Spontanous Breathing  Post-op Assessment: Report given to RN and Post -op Vital signs reviewed and stable  Post vital signs: Reviewed and stable  Last Vitals:  Vitals Value Taken Time  BP 101/57 10/16/21 1100  Temp 35.8 C 10/16/21 1100  Pulse 70 10/16/21 1100  Resp 13 10/16/21 1100  SpO2 98 % 10/16/21 1100  Vitals shown include unvalidated device data.  Last Pain:  Vitals:   10/16/21 1100  TempSrc: Temporal  PainSc: Asleep         Complications: No notable events documented.

## 2021-10-17 ENCOUNTER — Encounter: Payer: Self-pay | Admitting: Gastroenterology

## 2021-10-17 LAB — SURGICAL PATHOLOGY

## 2021-10-18 ENCOUNTER — Encounter: Payer: Self-pay | Admitting: Gastroenterology

## 2021-11-10 DIAGNOSIS — S4991XA Unspecified injury of right shoulder and upper arm, initial encounter: Secondary | ICD-10-CM | POA: Diagnosis not present

## 2021-11-10 DIAGNOSIS — Z8781 Personal history of (healed) traumatic fracture: Secondary | ICD-10-CM | POA: Diagnosis not present

## 2021-11-10 DIAGNOSIS — M25511 Pain in right shoulder: Secondary | ICD-10-CM | POA: Diagnosis not present

## 2021-12-04 ENCOUNTER — Other Ambulatory Visit (HOSPITAL_COMMUNITY): Payer: Self-pay | Admitting: Family Medicine

## 2021-12-04 ENCOUNTER — Ambulatory Visit (HOSPITAL_COMMUNITY)
Admission: RE | Admit: 2021-12-04 | Discharge: 2021-12-04 | Disposition: A | Discharge status: Home / Self Care | Payer: BC Managed Care – PPO | Source: Ambulatory Visit | Attending: Family Medicine | Admitting: Family Medicine

## 2021-12-04 ENCOUNTER — Other Ambulatory Visit: Payer: Self-pay

## 2021-12-04 DIAGNOSIS — R0789 Other chest pain: Secondary | ICD-10-CM | POA: Diagnosis not present

## 2021-12-04 DIAGNOSIS — E6609 Other obesity due to excess calories: Secondary | ICD-10-CM | POA: Diagnosis not present

## 2021-12-04 DIAGNOSIS — R079 Chest pain, unspecified: Secondary | ICD-10-CM | POA: Diagnosis not present

## 2021-12-04 DIAGNOSIS — S301XXA Contusion of abdominal wall, initial encounter: Secondary | ICD-10-CM | POA: Diagnosis not present

## 2021-12-12 DIAGNOSIS — K219 Gastro-esophageal reflux disease without esophagitis: Secondary | ICD-10-CM | POA: Diagnosis not present

## 2021-12-12 DIAGNOSIS — J4520 Mild intermittent asthma, uncomplicated: Secondary | ICD-10-CM | POA: Diagnosis not present

## 2021-12-12 DIAGNOSIS — E785 Hyperlipidemia, unspecified: Secondary | ICD-10-CM | POA: Diagnosis not present

## 2021-12-12 DIAGNOSIS — F3341 Major depressive disorder, recurrent, in partial remission: Secondary | ICD-10-CM | POA: Diagnosis not present

## 2022-01-17 DIAGNOSIS — S83242A Other tear of medial meniscus, current injury, left knee, initial encounter: Secondary | ICD-10-CM | POA: Diagnosis not present

## 2022-01-24 DIAGNOSIS — R030 Elevated blood-pressure reading, without diagnosis of hypertension: Secondary | ICD-10-CM | POA: Diagnosis not present

## 2022-01-25 DIAGNOSIS — Z135 Encounter for screening for eye and ear disorders: Secondary | ICD-10-CM | POA: Diagnosis not present

## 2022-01-25 DIAGNOSIS — Z978 Presence of other specified devices: Secondary | ICD-10-CM | POA: Diagnosis not present

## 2022-01-25 DIAGNOSIS — M23352 Other meniscus derangements, posterior horn of lateral meniscus, left knee: Secondary | ICD-10-CM | POA: Diagnosis not present

## 2022-01-25 DIAGNOSIS — S8002XA Contusion of left knee, initial encounter: Secondary | ICD-10-CM | POA: Diagnosis not present

## 2022-01-25 DIAGNOSIS — M23362 Other meniscus derangements, other lateral meniscus, left knee: Secondary | ICD-10-CM | POA: Diagnosis not present

## 2022-01-30 DIAGNOSIS — S8002XA Contusion of left knee, initial encounter: Secondary | ICD-10-CM | POA: Diagnosis not present

## 2022-05-23 DIAGNOSIS — R0789 Other chest pain: Secondary | ICD-10-CM | POA: Diagnosis not present

## 2022-07-15 DIAGNOSIS — J019 Acute sinusitis, unspecified: Secondary | ICD-10-CM | POA: Diagnosis not present

## 2022-07-15 DIAGNOSIS — J4520 Mild intermittent asthma, uncomplicated: Secondary | ICD-10-CM | POA: Diagnosis not present

## 2022-07-15 DIAGNOSIS — J301 Allergic rhinitis due to pollen: Secondary | ICD-10-CM | POA: Diagnosis not present

## 2022-08-23 DIAGNOSIS — R1084 Generalized abdominal pain: Secondary | ICD-10-CM | POA: Diagnosis not present

## 2022-10-17 DIAGNOSIS — E6609 Other obesity due to excess calories: Secondary | ICD-10-CM | POA: Diagnosis not present

## 2022-10-17 DIAGNOSIS — Z6834 Body mass index (BMI) 34.0-34.9, adult: Secondary | ICD-10-CM | POA: Diagnosis not present

## 2022-10-17 DIAGNOSIS — Z1331 Encounter for screening for depression: Secondary | ICD-10-CM | POA: Diagnosis not present

## 2022-10-17 DIAGNOSIS — I10 Essential (primary) hypertension: Secondary | ICD-10-CM | POA: Diagnosis not present

## 2022-10-17 DIAGNOSIS — S301XXA Contusion of abdominal wall, initial encounter: Secondary | ICD-10-CM | POA: Diagnosis not present

## 2022-10-17 DIAGNOSIS — Z0001 Encounter for general adult medical examination with abnormal findings: Secondary | ICD-10-CM | POA: Diagnosis not present

## 2023-12-10 DIAGNOSIS — E6609 Other obesity due to excess calories: Secondary | ICD-10-CM | POA: Diagnosis not present

## 2023-12-10 DIAGNOSIS — Z0000 Encounter for general adult medical examination without abnormal findings: Secondary | ICD-10-CM | POA: Diagnosis not present

## 2023-12-10 DIAGNOSIS — Z6833 Body mass index (BMI) 33.0-33.9, adult: Secondary | ICD-10-CM | POA: Diagnosis not present

## 2023-12-10 DIAGNOSIS — Z1331 Encounter for screening for depression: Secondary | ICD-10-CM | POA: Diagnosis not present

## 2023-12-10 DIAGNOSIS — I10 Essential (primary) hypertension: Secondary | ICD-10-CM | POA: Diagnosis not present

## 2023-12-30 DIAGNOSIS — J301 Allergic rhinitis due to pollen: Secondary | ICD-10-CM | POA: Diagnosis not present
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
## 2024-02-18 ENCOUNTER — Other Ambulatory Visit (HOSPITAL_BASED_OUTPATIENT_CLINIC_OR_DEPARTMENT_OTHER): Payer: Self-pay | Admitting: Family Medicine

## 2024-02-18 DIAGNOSIS — I10 Essential (primary) hypertension: Secondary | ICD-10-CM
